# Patient Record
Sex: Male | Born: 1995 | Race: White | Hispanic: No | Marital: Single | Smoking: Never smoker
Health system: Southern US, Community
[De-identification: ages and names within clinical notes are randomized; demographics above are authoritative.]

## PROBLEM LIST (undated history)

## (undated) DIAGNOSIS — K219 Gastro-esophageal reflux disease without esophagitis: Secondary | ICD-10-CM

## (undated) DIAGNOSIS — K8020 Calculus of gallbladder without cholecystitis without obstruction: Secondary | ICD-10-CM

## (undated) DIAGNOSIS — R7989 Other specified abnormal findings of blood chemistry: Secondary | ICD-10-CM

## (undated) DIAGNOSIS — D497 Neoplasm of unspecified behavior of endocrine glands and other parts of nervous system: Secondary | ICD-10-CM

## (undated) DIAGNOSIS — E669 Obesity, unspecified: Secondary | ICD-10-CM

## (undated) DIAGNOSIS — E349 Endocrine disorder, unspecified: Secondary | ICD-10-CM

## (undated) HISTORY — PX: CHOLECYSTECTOMY: SHX55

## (undated) HISTORY — PX: APPENDECTOMY: SHX54

---
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
## 2018-04-01 ENCOUNTER — Emergency Department (HOSPITAL_BASED_OUTPATIENT_CLINIC_OR_DEPARTMENT_OTHER)
Admission: EM | Admit: 2018-04-01 | Discharge: 2018-04-02 | Disposition: A | Discharge status: Home / Self Care | Payer: Self-pay | Attending: Emergency Medicine | Admitting: Emergency Medicine

## 2018-04-01 ENCOUNTER — Encounter (HOSPITAL_BASED_OUTPATIENT_CLINIC_OR_DEPARTMENT_OTHER): Payer: Self-pay | Admitting: *Deleted

## 2018-04-01 ENCOUNTER — Other Ambulatory Visit: Payer: Self-pay

## 2018-04-01 DIAGNOSIS — M545 Low back pain, unspecified: Secondary | ICD-10-CM

## 2018-04-01 DIAGNOSIS — R52 Pain, unspecified: Secondary | ICD-10-CM

## 2018-04-01 DIAGNOSIS — Z7722 Contact with and (suspected) exposure to environmental tobacco smoke (acute) (chronic): Secondary | ICD-10-CM | POA: Insufficient documentation

## 2018-04-01 NOTE — ED Triage Notes (Signed)
pt c/o back pain x 13 days , seen at [facility] x 3 days ago for same, labs and xray, ekg done

## 2018-04-02 ENCOUNTER — Emergency Department (HOSPITAL_BASED_OUTPATIENT_CLINIC_OR_DEPARTMENT_OTHER): Payer: Self-pay

## 2018-04-02 MED ORDER — CYCLOBENZAPRINE HCL 10 MG PO TABS: 10.0000 mg | ORAL_TABLET | Freq: Every day | ORAL | 0 refills | Status: AC

## 2018-04-02 MED ORDER — KETOROLAC TROMETHAMINE 60 MG/2ML IM SOLN
30.0000 mg | Freq: Once | INTRAMUSCULAR | Status: AC
  Administered 2018-04-02: 30 mg via INTRAMUSCULAR
  Filled 2018-04-02: qty 2

## 2018-04-02 NOTE — ED Provider Notes (Addendum)
[facility] EMERGENCY DEPARTMENT Provider Note  CSN: [id number] Arrival date & time: 04/01/18 2153  Chief Complaint(s) Back Pain  HPI Jay Olson is a 22 y.o. male with a history of MPN 1 who presents to the emergency department for 2 weeks of lower back pain.  Pain is located in the lumbar sacral region near the center and radiates out bilaterally.  Pain has been constant in nature but fluctuating.  Mostly felt with certain positions and movement.  Alleviated certain positions such as sitting up or lying reclined.  Patient takes Motrin which does provide some relief.  He denies any other alleviating or aggravating factors.  States that the pain at times radiates out into bilateral lateral thighs and sometimes the groin. Patient denies any trauma.  No bladder/bowel incontinence.  No lower extremity weakness or loss of sensation.  Patient does report that he is been working on his posture because he sits at a computer a lot.  Denies any urinary symptoms.  Patient was actually at [facility] 3 days ago for the same and had screening labs obtained which were grossly reassuring without any significant electrolyte derangements such as hypokalemia, hyper/hypocalcemia.  Urinalysis was negative for infection.  HPI  Past Medical History Past Medical History:  Diagnosis Date  . Acid reflux   . Gallstones   . Obesity    Patient Active Problem List   Diagnosis Date Noted  . Gallstones 05/03/2013   Home Medication(s) Prior to Admission medications   Medication Sig Start Date End Date Taking? Authorizing Provider  cyclobenzaprine (FLEXERIL) 10 MG tablet Take 1 tablet (10 mg total) by mouth at bedtime for 10 days. 04/02/18 04/12/18  Fatima Blank, MD                                                                                                                                    Past Surgical History Past Surgical History:  Procedure Laterality Date  . APPENDECTOMY    .  CHOLECYSTECTOMY     Family History History reviewed. No pertinent family history.  Social History Social History   Tobacco Use  . Smoking status: Passive Smoke Exposure - Never Smoker  . Smokeless tobacco: Never Used  Substance Use Topics  . Alcohol use: No  . Drug use: No   Allergies Patient has no known allergies.  Review of Systems Review of Systems All other systems are reviewed and are negative for acute change except as noted in the HPI  Physical Exam Vital Signs  I have reviewed the triage vital signs BP 131/82 (BP Location: Right Arm)   Pulse 78   Temp 98.2 F (36.8 C) (Oral)   Resp 18   Ht 6\' 6"  (1.981 m)   Wt (!) 164.3 kg   SpO2 100%   BMI 41.86 kg/m   Physical Exam  Musculoskeletal:       Lumbar back: He exhibits tenderness and  spasm.       Back:  Neurological:  Spine Exam: Strength: 5/5 throughout LE bilaterally (hip flexion/extension, adduction/abduction; knee flexion/extension; foot dorsiflexion/plantarflexion, inversion/eversion; great toe inversion) Sensation: Intact to light touch in proximal and distal LE bilaterally Reflexes: 1+ quadriceps and achilles reflexes   Skin:  Numerous skin tags throughout the entire body    ED Results and Treatments Labs (all labs ordered are listed, but only abnormal results are displayed) Labs Reviewed - No data to display                                                                                                                       EKG  EKG Interpretation  Date/Time:    Ventricular Rate:    PR Interval:    QRS Duration:   QT Interval:    QTC Calculation:   R Axis:     Text Interpretation:        Radiology Dg Lumbar Spine Complete  Result Date: 04/02/2018 CLINICAL DATA:  Lumbosacral back pain with bilateral thigh pain. Symptoms for 2 weeks. No known injury. EXAM: LUMBAR SPINE - COMPLETE 4+ VIEW COMPARISON:  None. FINDINGS: There are 6 non-rib-bearing lumbar vertebra. Mild straightening of  normal lordosis. Vertebral body heights are normal. There is no listhesis. The posterior elements are intact. Disc spaces are preserved. No fracture. Sacroiliac joints are symmetric and normal. Multiple rounded densities projecting over the soft tissues may be external artifact or subcutaneous. IMPRESSION: Straightening of normal lordosis can be seen with muscle spasm or positioning. No acute osseous abnormality. Electronically Signed   By: Jeb Levering M.D.   On: 04/02/2018 00:45   Pertinent labs & imaging results that were available during my care of the patient were reviewed by me and considered in my medical decision making (see chart for details).  Medications Ordered in ED Medications  ketorolac (TORADOL) injection 30 mg (30 mg Intramuscular Given 04/02/18 0033)                                                                                                                                    Procedures Procedures  (including critical care time)  Medical Decision Making / ED Course I have reviewed the nursing notes for this encounter and the patient's prior records (if available in EHR or on provided paperwork).    22 y.o. male presents with back pain in lumbosacral area for 2 weks  without signs of radicular pain. No acute traumatic onset. No red flag symptoms of fever, weight loss, saddle anesthesia, weakness, fecal/urinary incontinence or urinary retention.   Suspect MSK etiology.  However due to the patient's history of Ambien, the plain film of the lumbar spine was obtained which did not reveal any bony lesion or occult fractures.  It did reveal straightening of the normal curvature possibly due to muscle spasm or postural change.  No indication for MRI at this time.   Patient was recommended to take short course of scheduled NSAIDs and engage in early mobility as definitive treatment. Return precautions discussed for worsening or new concerning symptoms.    Final Clinical  Impression(s) / ED Diagnoses Final diagnoses:  Acute bilateral low back pain without sciatica    Disposition: Discharge  Condition: Good  I have discussed the results, Dx and Tx plan with the patient and parents who expressed understanding and agree(s) with the plan. Discharge instructions discussed at great length. The patient and parents was given strict return precautions who verbalized understanding of the instructions. No further questions at time of discharge.    ED Discharge Orders         Ordered    cyclobenzaprine (FLEXERIL) 10 MG tablet  Daily at bedtime     04/02/18 0115           Follow Up: Primary care provider  Schedule an appointment as soon as possible for a visit  In 1 to 2 weeks, If symptoms do not improve or  worsen      This chart was dictated using voice recognition software.  Despite best efforts to proofread,  errors can occur which can change the documentation meaning.       Fatima Blank, MD 04/02/18 [phone]

## 2018-04-02 NOTE — Discharge Instructions (Signed)
You may use over-the-counter Motrin (Ibuprofen), Acetaminophen (Tylenol), topical muscle creams such as SalonPas, Icy Hot, Bengay, etc. Please stretch, apply heat, and have massage therapy for additional assistance. ° °

## 2018-04-28 ENCOUNTER — Emergency Department (HOSPITAL_BASED_OUTPATIENT_CLINIC_OR_DEPARTMENT_OTHER)
Admission: EM | Admit: 2018-04-28 | Discharge: 2018-04-28 | Disposition: A | Discharge status: Home / Self Care | Payer: Self-pay | Attending: Emergency Medicine | Admitting: Emergency Medicine

## 2018-04-28 ENCOUNTER — Other Ambulatory Visit: Payer: Self-pay

## 2018-04-28 ENCOUNTER — Encounter (HOSPITAL_BASED_OUTPATIENT_CLINIC_OR_DEPARTMENT_OTHER): Payer: Self-pay

## 2018-04-28 DIAGNOSIS — L0501 Pilonidal cyst with abscess: Secondary | ICD-10-CM | POA: Insufficient documentation

## 2018-04-28 MED ORDER — LIDOCAINE HCL (PF) 1 % IJ SOLN
10.0000 mL | Freq: Once | INTRAMUSCULAR | Status: AC
  Administered 2018-04-28: 10 mL
  Filled 2018-04-28: qty 10

## 2018-04-28 MED ORDER — PROBIOTIC 250 MG PO CAPS: 1.0000 | ORAL_CAPSULE | Freq: Every day | ORAL | 0 refills | Status: AC

## 2018-04-28 MED ORDER — CLINDAMYCIN HCL 150 MG PO CAPS
450.0000 mg | ORAL_CAPSULE | Freq: Once | ORAL | Status: AC
  Administered 2018-04-28: 450 mg via ORAL
  Filled 2018-04-28: qty 3

## 2018-04-28 MED ORDER — CLINDAMYCIN HCL 150 MG PO CAPS: 450.0000 mg | ORAL_CAPSULE | Freq: Three times a day (TID) | ORAL | 0 refills | Status: AC

## 2018-04-28 NOTE — Discharge Instructions (Addendum)
Take clindamycin 3 times daily until completed.  Take a probiotic with this daily.  You can do warm soaks starting tomorrow to help continue drainage.  Please return in 2 days for wound recheck.  Please return sooner if you develop any increasing pain, redness, swelling, red streaking from the area, or fevers.

## 2018-04-28 NOTE — ED Triage Notes (Signed)
C/o ?abscess to "tailbone"-NAD-to triage in w/c

## 2018-04-28 NOTE — ED Provider Notes (Addendum)
[facility] EMERGENCY DEPARTMENT Provider Note   CSN: [id number] Arrival date & time: 04/28/18  2057     History   Chief Complaint Chief Complaint  Patient presents with  . Abscess    HPI Jay Olson is a 22 y.o. male with history of MEN 1, obesity, acid reflux who presents with a 2-day history of abscess to pilonidal area.  Patient has no history of abscesses.  His mother reports began as an area of redness with a large hair in the middle of it.  The mother pulled the hair out.  The area became increasingly swollen, red, and painful.  Patient denies any fevers.  He denies any nausea, vomiting.  No other interventions taken prior to arrival.  HPI  Past Medical History:  Diagnosis Date  . Acid reflux   . Gallstones   . Obesity     Patient Active Problem List   Diagnosis Date Noted  . Gallstones 05/03/2013    Past Surgical History:  Procedure Laterality Date  . APPENDECTOMY    . CHOLECYSTECTOMY          Home Medications    Prior to Admission medications   Medication Sig Start Date End Date Taking? Authorizing Provider  clindamycin (CLEOCIN) 150 MG capsule Take 3 capsules (450 mg total) by mouth 3 (three) times daily. 04/28/18   Braven Wolk, Bea Graff, PA-C  Saccharomyces boulardii (PROBIOTIC) 250 MG CAPS Take 1 capsule by mouth daily. 04/28/18   Frederica Kuster, PA-C    Family History No family history on file.  Social History Social History   Tobacco Use  . Smoking status: Never Smoker  . Smokeless tobacco: Never Used  Substance Use Topics  . Alcohol use: No  . Drug use: No     Allergies   Patient has no known allergies.   Review of Systems Review of Systems  Constitutional: Negative for fever.  Skin: Positive for color change and wound.     Physical Exam Updated Vital Signs BP 128/77 (BP Location: Left Arm)   Pulse (!) 120   Temp 98.3 F (36.8 C) (Oral)   Resp 20   Ht 6\' 7"  (2.007 m)   Wt (!) 163.3 kg   SpO2 93%   BMI 40.56  kg/m   Physical Exam  Constitutional: He appears well-developed and well-nourished. No distress.  HENT:  Head: Normocephalic and atraumatic.  Mouth/Throat: Oropharynx is clear and moist. No oropharyngeal exudate.  Eyes: Pupils are equal, round, and reactive to light. Conjunctivae are normal. Right eye exhibits no discharge. Left eye exhibits no discharge. No scleral icterus.  Neck: Normal range of motion. Neck supple. No thyromegaly present.  Cardiovascular: Normal rate, regular rhythm, normal heart sounds and intact distal pulses. Exam reveals no gallop and no friction rub.  No murmur heard. Pulmonary/Chest: Effort normal and breath sounds normal. No stridor. No respiratory distress. He has no wheezes. He has no rales.  Abdominal: Soft. Bowel sounds are normal. He exhibits no distension. There is no tenderness. There is no rebound and no guarding.  Genitourinary:  Genitourinary Comments: Pilonidal abscess with 3 x 2 area of erythema and tenderness with fluctuance in the center  Musculoskeletal: He exhibits no edema.  Lymphadenopathy:    He has no cervical adenopathy.  Neurological: He is alert. Coordination normal.  Skin: Skin is warm and dry. No rash noted. He is not diaphoretic. No pallor.  Diffuse skin tags  Psychiatric: He has a normal mood and affect.  Nursing note  and vitals reviewed.    ED Treatments / Results  Labs (all labs ordered are listed, but only abnormal results are displayed) Labs Reviewed  AEROBIC CULTURE (SUPERFICIAL SPECIMEN)    EKG None  Radiology No results found.  Procedures .Marland KitchenIncision and Drainage Date/Time: 04/28/2018 11:20 PM Performed by: Frederica Kuster, PA-C Authorized by: Frederica Kuster, PA-C   Consent:    Consent obtained:  Verbal   Consent given by:  Patient   Risks discussed:  Incomplete drainage, bleeding and infection   Alternatives discussed:  Alternative treatment Location:    Type:  Pilonidal cyst   Size:  2x3   Location:   Anogenital   Anogenital location:  Pilonidal Pre-procedure details:    Skin preparation:  Chloraprep Anesthesia (see MAR for exact dosages):    Anesthesia method:  Local infiltration   Local anesthetic:  Lidocaine 1% w/o epi Procedure type:    Complexity:  Simple Procedure details:    Needle aspiration: no     Incision types:  Stab incision   Incision depth:  Dermal   Scalpel blade:  11   Wound management:  Probed and deloculated, irrigated with saline and extensive cleaning   Drainage:  Bloody, serous and purulent   Drainage amount:  Copious   Wound treatment:  Wound left open   Packing materials:  1/4 in iodoform gauze   Amount 1/4" iodoform:  4 cm Post-procedure details:    Patient tolerance of procedure:  Tolerated well, no immediate complications   (including critical care time)  EMERGENCY DEPARTMENT US SOFT TISSUE INTERPRETATION "Study: Limited Soft Tissue Ultrasound"  INDICATIONS: Soft tissue infection Multiple views of the body part were obtained in real-time with a multi-frequency linear probe  PERFORMED BY: Myself IMAGES ARCHIVED?: Yes SIDE:Midline BODY PART:Pilonidal INTERPRETATION:  Abcess present     Medications Ordered in ED Medications  lidocaine (PF) (XYLOCAINE) 1 % injection 10 mL (10 mLs Infiltration Given 04/28/18 2219)  clindamycin (CLEOCIN) capsule 450 mg (450 mg Oral Given 04/28/18 2316)     Initial Impression / Assessment and Plan / ED Course  I have reviewed the triage vital signs and the nursing notes.  Pertinent labs & imaging results that were available during my care of the patient were reviewed by me and considered in my medical decision making (see chart for details).     Patient with skin abscess. Incision and drainage performed in the ED today.  Small amount of packing placed.  Wound recheck in 2 days. Supportive care and return precautions discussed.  Pt sent home with clindamycin with probiotic.  Wound culture sent.  Strict return  precautions for reasons to return sooner discussed.  Patient and mother understand and agree with plan.  Patient vitals stable throughout ED course and discharged in satisfactory condition.   Final Clinical Impressions(s) / ED Diagnoses   Final diagnoses:  Pilonidal abscess    ED Discharge Orders         Ordered    clindamycin (CLEOCIN) 150 MG capsule  3 times daily     04/28/18 2310    Saccharomyces boulardii (PROBIOTIC) 250 MG CAPS  Daily     04/28/18 2310               Frederica Kuster, PA-C 04/28/18 2332    Dorie Rank, MD 04/29/18 1441

## 2018-05-01 ENCOUNTER — Emergency Department (HOSPITAL_BASED_OUTPATIENT_CLINIC_OR_DEPARTMENT_OTHER)
Admission: EM | Admit: 2018-05-01 | Discharge: 2018-05-01 | Disposition: A | Discharge status: Home / Self Care | Payer: Self-pay | Attending: Emergency Medicine | Admitting: Emergency Medicine

## 2018-05-01 ENCOUNTER — Encounter (HOSPITAL_BASED_OUTPATIENT_CLINIC_OR_DEPARTMENT_OTHER): Payer: Self-pay | Admitting: Adult Health

## 2018-05-01 ENCOUNTER — Other Ambulatory Visit: Payer: Self-pay

## 2018-05-01 DIAGNOSIS — Z5189 Encounter for other specified aftercare: Secondary | ICD-10-CM

## 2018-05-01 DIAGNOSIS — R300 Dysuria: Secondary | ICD-10-CM | POA: Insufficient documentation

## 2018-05-01 DIAGNOSIS — Z4801 Encounter for change or removal of surgical wound dressing: Secondary | ICD-10-CM | POA: Insufficient documentation

## 2018-05-01 HISTORY — DX: Endocrine disorder, unspecified: E34.9

## 2018-05-01 HISTORY — DX: Other specified abnormal findings of blood chemistry: R79.89

## 2018-05-01 HISTORY — DX: Neoplasm of unspecified behavior of endocrine glands and other parts of nervous system: D49.7

## 2018-05-01 LAB — AEROBIC CULTURE  (SUPERFICIAL SPECIMEN)

## 2018-05-01 LAB — URINALYSIS, ROUTINE W REFLEX MICROSCOPIC
Bilirubin Urine: NEGATIVE
KETONES UR: 15 mg/dL — AB
LEUKOCYTES UA: NEGATIVE
Nitrite: NEGATIVE
PH: 5.5 (ref 5.0–8.0)
Protein, ur: NEGATIVE mg/dL
Specific Gravity, Urine: 1.025 (ref 1.005–1.030)

## 2018-05-01 LAB — URINALYSIS, MICROSCOPIC (REFLEX)

## 2018-05-01 LAB — AEROBIC CULTURE W GRAM STAIN (SUPERFICIAL SPECIMEN): Special Requests: NORMAL

## 2018-05-01 NOTE — ED Triage Notes (Signed)
Presents with pilonidal cyst that was packed 2 days ago. He is here for a wound recheck and concerns about his antibiotics that is causing diarrhea and bilateral flank pain after urinating. He is worried the antibiotic is causing renal failure.

## 2018-05-01 NOTE — Discharge Instructions (Signed)
Continue clindamycin and probiotics.  Please apply warm compresses the area at least 3-4 times per day.  Follow attached handout.  Follow-up with your primary care doctor in the next 1 week. If you develop worsening or new concerning symptoms you can return to the emergency department for re-evaluation.

## 2018-05-01 NOTE — ED Provider Notes (Signed)
[name] EMERGENCY DEPARTMENT Provider Note   CSN: [id number] Arrival date & time: 05/01/18  1006     History   Chief Complaint Chief Complaint  Patient presents with  . Wound Check    HPI Jay Olson is a 22 y.o. male with a history of elevated growth hormones, pituitary tumor presents emergency department today for wound recheck.  Patient was seen here on 9/17 for the same.  Is found to have a pilonidal abscess that was I & D and packed. The patient has been following wound care instructions at home. He notes the drainage has slowed. No fever, N/V.  He has been compliant with his home clindamycin.  Wound cultures were obtained but have not fully resulted at this time.   HPI  Past Medical History:  Diagnosis Date  . Acid reflux   . Elevated growth hormone (GH) level   . Gallstones   . Obesity   . Pituitary tumor     Patient Active Problem List   Diagnosis Date Noted  . Gallstones 05/03/2013    Past Surgical History:  Procedure Laterality Date  . APPENDECTOMY    . CHOLECYSTECTOMY          Home Medications    Prior to Admission medications   Medication Sig Start Date End Date Taking? Authorizing Provider  clindamycin (CLEOCIN) 150 MG capsule Take 3 capsules (450 mg total) by mouth 3 (three) times daily. 04/28/18   Law, Bea Graff, PA-C  Saccharomyces boulardii (PROBIOTIC) 250 MG CAPS Take 1 capsule by mouth daily. 04/28/18   Frederica Kuster, PA-C    Family History History reviewed. No pertinent family history.  Social History Social History   Tobacco Use  . Smoking status: Never Smoker  . Smokeless tobacco: Never Used  Substance Use Topics  . Alcohol use: No  . Drug use: No     Allergies   Patient has no known allergies.   Review of Systems Review of Systems  Constitutional: Negative for fever.  Gastrointestinal: Negative for nausea and vomiting.  Skin: Negative for color change.     Physical Exam Updated Vital Signs BP  132/85 (BP Location: Right Arm)   Pulse (!) 106   Temp 98.6 F (37 C) (Oral)   Resp 20   Ht 6\' 7"  (2.007 m)   Wt (!) 163.3 kg   SpO2 100%   BMI 40.56 kg/m   Physical Exam  Constitutional: He appears well-developed and well-nourished.  HENT:  Head: Normocephalic and atraumatic.  Right Ear: External ear normal.  Left Ear: External ear normal.  Eyes: Conjunctivae are normal. Right eye exhibits no discharge. Left eye exhibits no discharge. No scleral icterus.  Pulmonary/Chest: Effort normal. No respiratory distress.  Abdominal: He exhibits no distension. There is no tenderness. There is no rigidity, no rebound, no guarding, no CVA tenderness, no tenderness at McBurney's point and negative Murphy's sign.  Genitourinary:  Genitourinary Comments: Patient with prior I&D to area of pilonidal region.  Packing was removed.  There is no purulent drainage at this time.  No surrounding erythema, induration or fluctuance.  Neurological: He is alert.  Skin: Skin is warm and dry. Capillary refill takes less than 2 seconds. No erythema. No pallor.  Psychiatric: He has a normal mood and affect.  Nursing note and vitals reviewed.    ED Treatments / Results  Labs (all labs ordered are listed, but only abnormal results are displayed) Labs Reviewed  URINALYSIS, ROUTINE W REFLEX MICROSCOPIC  EKG None  Radiology No results found.  Procedures Procedures (including critical care time)  Medications Ordered in ED Medications - No data to display   Initial Impression / Assessment and Plan / ED Course  I have reviewed the triage vital signs and the nursing notes.  Pertinent labs & imaging results that were available during my care of the patient were reviewed by me and considered in my medical decision making (see chart for details).      22 year old male presenting with recent incision and drainage for pilonidal.  Patient denies any fever, nausea, vomiting.  Packing was removed.  No  purulent drainage at this time.  No surrounding erythema, induration or further fluctuance.  No indication for repacking at this time.  Patient is afebrile in department with otherwise reassuring vitals.  Will have him follow-up with his PCP.  Recommended warm compresses multiple times per day.  Patient states that he was reading on Web MD that clindamycin can cause renal failure.  He notes one episode of pain with urination as well as some pain in his "kidneys" after the first dose that is since resolved.  He is currently asymptomatic.  His urinalysis is unremarkable.  His abdominal exam is benign.  There is no peritoneal signs or tenderness.  He has no CVA tenderness.  No further work-up indicated.  He has no signs of fluid overload or signs of renal failure at this time. I advised the patient to follow-up with PCP this week. Specific return precautions discussed. Time was given for all questions to be answered. The patient verbalized understanding and agreement with plan. The patient appears safe for discharge home.  Final Clinical Impressions(s) / ED Diagnoses   Final diagnoses:  Visit for wound check    ED Discharge Orders    None       Jillyn Ledger, PA-C 05/01/18 1104    Lennice Sites, DO 05/01/18 1520

## 2018-05-02 ENCOUNTER — Telehealth: Payer: Self-pay | Admitting: Emergency Medicine

## 2018-05-02 NOTE — Telephone Encounter (Signed)
Post ED Visit - Positive Culture Follow-up  Culture report reviewed by antimicrobial stewardship pharmacist:  []  Elenor Quinones, Pharm.D. []  Heide Guile, Pharm.D., BCPS AQ-ID []  Parks Neptune, Pharm.D., BCPS []  Alycia Rossetti, Pharm.D., BCPS []  [place], Pharm.D., BCPS, AAHIVP []  Legrand Como, Pharm.D., BCPS, AAHIVP [x]  Salome Arnt, PharmD, BCPS []  Johnnette Gourd, PharmD, BCPS []  Hughes Better, PharmD, BCPS []  Leeroy Cha, PharmD  Positive wound culture Treated with clindamycin, organism sensitive to the same and no further patient follow-up is required at this time.  Hazle Nordmann 05/02/2018, 11:38 AM

## 2018-05-14 ENCOUNTER — Emergency Department (HOSPITAL_BASED_OUTPATIENT_CLINIC_OR_DEPARTMENT_OTHER): Payer: Medicaid Other

## 2018-05-14 ENCOUNTER — Encounter (HOSPITAL_BASED_OUTPATIENT_CLINIC_OR_DEPARTMENT_OTHER): Payer: Self-pay

## 2018-05-14 ENCOUNTER — Emergency Department (HOSPITAL_BASED_OUTPATIENT_CLINIC_OR_DEPARTMENT_OTHER)
Admission: EM | Admit: 2018-05-14 | Discharge: 2018-05-14 | Disposition: A | Discharge status: Other Acute Inpatient Hospital | Payer: Medicaid Other | Attending: Emergency Medicine | Admitting: Emergency Medicine

## 2018-05-14 ENCOUNTER — Other Ambulatory Visit: Payer: Self-pay

## 2018-05-14 DIAGNOSIS — R739 Hyperglycemia, unspecified: Secondary | ICD-10-CM | POA: Diagnosis not present

## 2018-05-14 DIAGNOSIS — R1084 Generalized abdominal pain: Secondary | ICD-10-CM | POA: Diagnosis not present

## 2018-05-14 DIAGNOSIS — Z79899 Other long term (current) drug therapy: Secondary | ICD-10-CM | POA: Diagnosis not present

## 2018-05-14 DIAGNOSIS — E860 Dehydration: Secondary | ICD-10-CM

## 2018-05-14 DIAGNOSIS — R112 Nausea with vomiting, unspecified: Secondary | ICD-10-CM | POA: Diagnosis present

## 2018-05-14 LAB — LIPASE, BLOOD: Lipase: 35 U/L (ref 11–51)

## 2018-05-14 LAB — COMPREHENSIVE METABOLIC PANEL
ALT: 82 U/L — ABNORMAL HIGH (ref 0–44)
ANION GAP: 14 (ref 5–15)
AST: 31 U/L (ref 15–41)
Albumin: 4.3 g/dL (ref 3.5–5.0)
Alkaline Phosphatase: 97 U/L (ref 38–126)
BUN: 10 mg/dL (ref 6–20)
CO2: 24 mmol/L (ref 22–32)
CREATININE: 1.32 mg/dL — AB (ref 0.61–1.24)
Calcium: 10.1 mg/dL (ref 8.9–10.3)
Chloride: 96 mmol/L — ABNORMAL LOW (ref 98–111)
GFR calc Af Amer: >60 mL/min (ref >60)
Glucose, Bld: 549 mg/dL (ref 70–99)
POTASSIUM: 3.6 mmol/L (ref 3.5–5.1)
SODIUM: 134 mmol/L — AB (ref 135–145)
Total Bilirubin: 1.2 mg/dL (ref 0.3–1.2)
Total Protein: 8.1 g/dL (ref 6.5–8.1)

## 2018-05-14 LAB — URINALYSIS, ROUTINE W REFLEX MICROSCOPIC
Bilirubin Urine: NEGATIVE
Glucose, UA: >=500 mg/dL — AB
Ketones, ur: 40 mg/dL — AB
LEUKOCYTES UA: NEGATIVE
Nitrite: NEGATIVE
PROTEIN: NEGATIVE mg/dL
SPECIFIC GRAVITY, URINE: 1.01 (ref 1.005–1.030)
pH: 5 (ref 5.0–8.0)

## 2018-05-14 LAB — CBC
HCT: 47.8 % (ref 39.0–52.0)
HEMOGLOBIN: 16.6 g/dL (ref 13.0–17.0)
MCH: 28.8 pg (ref 26.0–34.0)
MCHC: 34.7 g/dL (ref 30.0–36.0)
MCV: 82.8 fL (ref 78.0–100.0)
PLATELETS: 369 10*3/uL (ref 150–400)
RBC: 5.77 MIL/uL (ref 4.22–5.81)
RDW: 12.7 % (ref 11.5–15.5)
WBC: 8.1 10*3/uL (ref 4.0–10.5)

## 2018-05-14 LAB — URINALYSIS, MICROSCOPIC (REFLEX)

## 2018-05-14 LAB — CBG MONITORING, ED: Glucose-Capillary: 420 mg/dL — ABNORMAL HIGH (ref 70–99)

## 2018-05-14 LAB — OCCULT BLOOD X 1 CARD TO LAB, STOOL: FECAL OCCULT BLD: POSITIVE — AB

## 2018-05-14 MED ORDER — INSULIN ASPART 100 UNIT/ML ~~LOC~~ SOLN
8.0000 [IU] | Freq: Once | SUBCUTANEOUS | Status: AC
  Administered 2018-05-14: 8 [IU] via SUBCUTANEOUS
  Filled 2018-05-14: qty 1

## 2018-05-14 MED ORDER — ONDANSETRON HCL 4 MG/2ML IJ SOLN
4.0000 mg | Freq: Once | INTRAMUSCULAR | Status: AC
  Administered 2018-05-14: 4 mg via INTRAVENOUS
  Filled 2018-05-14: qty 2

## 2018-05-14 MED ORDER — IOPAMIDOL (ISOVUE-300) INJECTION 61%
100.0000 mL | Freq: Once | INTRAVENOUS | Status: AC | PRN
  Administered 2018-05-14: 100 mL via INTRAVENOUS

## 2018-05-14 MED ORDER — SODIUM CHLORIDE 0.9 % IV SOLN
INTRAVENOUS | Status: DC
  Administered 2018-05-14: 10:00:00 via INTRAVENOUS

## 2018-05-14 MED ORDER — IOPAMIDOL (ISOVUE-300) INJECTION 61%
30.0000 mL | Freq: Once | INTRAVENOUS | Status: AC | PRN
  Administered 2018-05-14: 15 mL via ORAL

## 2018-05-14 MED ORDER — SODIUM CHLORIDE 0.9 % IV BOLUS
1000.0000 mL | Freq: Once | INTRAVENOUS | Status: AC
  Administered 2018-05-14: 1000 mL via INTRAVENOUS

## 2018-05-14 MED ORDER — FAMOTIDINE IN NACL 20-0.9 MG/50ML-% IV SOLN
20.0000 mg | Freq: Once | INTRAVENOUS | Status: AC
  Administered 2018-05-14: 20 mg via INTRAVENOUS
  Filled 2018-05-14: qty 50

## 2018-05-14 MED ORDER — GI COCKTAIL ~~LOC~~
30.0000 mL | Freq: Once | ORAL | Status: AC
  Administered 2018-05-14: 30 mL via ORAL
  Filled 2018-05-14: qty 30

## 2018-05-14 NOTE — ED Triage Notes (Signed)
Pt c/o n/v/d, abdominal pain and generalized weakness for the last few days with acid reflux, last time he vomited was Wednesday midday

## 2018-05-14 NOTE — ED Notes (Signed)
Report given to Louie Casa with [facility] and [employer], Nurse, children's at [facility]

## 2018-05-14 NOTE — ED Notes (Signed)
Date and time results received: 05/14/18    Test: Glucose Critical Value: 549  Name of Provider Notified: Rancour

## 2018-05-14 NOTE — ED Notes (Signed)
Pt is asleep in waiting room, resting comfortably, no acute distress

## 2018-05-14 NOTE — ED Provider Notes (Addendum)
[facility] EMERGENCY DEPARTMENT Provider Note   CSN: [id number] Arrival date & time: 05/14/18  0228     History   Chief Complaint Chief Complaint  Patient presents with  . Abdominal Pain    HPI Jay Olson is a 22 y.o. male.  Patient with history of pituitary tumor, MEN1 syndrome, acid reflux disease presenting with a 5-day history of nausea, vomiting, abdominal pain and diarrhea.  States the vomiting is happening about once or twice daily that is nonbilious and nonbloody.  His diarrhea is uncontrolled and happening 10 or 20 times daily.  He denies any blood in the stool.  Is sometimes yellow and sometimes dark.  He says diffuse abdominal cramping that is fairly constant.  Denies fever.  Denies recent travel.  Has had sick contacts at home with respiratory illnesses only.  He recently completed antibiotics for a pilonidal abscess which is improving. Previous cholecystectomy and appendectomy.  No pain with urination or blood in the urine.  The history is provided by the patient.  Abdominal Pain   Associated symptoms include diarrhea, nausea and vomiting. Pertinent negatives include fever, dysuria, hematuria, headaches, arthralgias and myalgias.    Past Medical History:  Diagnosis Date  . Acid reflux   . Elevated growth hormone (GH) level   . Gallstones   . Obesity   . Pituitary tumor     Patient Active Problem List   Diagnosis Date Noted  . Gallstones 05/03/2013    Past Surgical History:  Procedure Laterality Date  . APPENDECTOMY    . CHOLECYSTECTOMY          Home Medications    Prior to Admission medications   Medication Sig Start Date End Date Taking? Authorizing Provider  clindamycin (CLEOCIN) 150 MG capsule Take 3 capsules (450 mg total) by mouth 3 (three) times daily. 04/28/18   Law, Bea Graff, PA-C  Saccharomyces boulardii (PROBIOTIC) 250 MG CAPS Take 1 capsule by mouth daily. 04/28/18   Frederica Kuster, PA-C    Family History No family  history on file.  Social History Social History   Tobacco Use  . Smoking status: Never Smoker  . Smokeless tobacco: Never Used  Substance Use Topics  . Alcohol use: No  . Drug use: No     Allergies   Patient has no known allergies.   Review of Systems Review of Systems  Constitutional: Positive for activity change and appetite change. Negative for fever.  HENT: Negative for congestion and rhinorrhea.   Respiratory: Negative for cough, chest tightness and shortness of breath.   Cardiovascular: Negative for chest pain.  Gastrointestinal: Positive for abdominal pain, diarrhea, nausea and vomiting.  Genitourinary: Negative for dysuria and hematuria.  Musculoskeletal: Negative for arthralgias and myalgias.  Skin: Negative for rash.  Neurological: Positive for dizziness, weakness and light-headedness. Negative for headaches.   all other systems are negative except as noted in the HPI and PMH.     Physical Exam Updated Vital Signs BP 137/74   Pulse 87   Temp 98.1 F (36.7 C)   Resp 16   Ht 6\' 7"  (2.007 m)   Wt (!) 162.4 kg   SpO2 96%   BMI 40.33 kg/m   Physical Exam  Constitutional: He is oriented to person, place, and time. He appears well-developed and well-nourished. No distress.  Morbidly obese, dry mucous membranes, pale appearing  HENT:  Head: Normocephalic and atraumatic.  Mouth/Throat: Oropharynx is clear and moist. No oropharyngeal exudate.  Eyes: Pupils are equal,  round, and reactive to light. Conjunctivae and EOM are normal.  Neck: Normal range of motion. Neck supple.  No meningismus.  Cardiovascular: Normal rate, regular rhythm, normal heart sounds and intact distal pulses.  No murmur heard. Pulmonary/Chest: Effort normal and breath sounds normal. No respiratory distress.  Abdominal: Soft. There is tenderness. There is no rebound and no guarding.  Mild diffuse tenderness without guarding or rebound.  Genitourinary:  Genitourinary Comments: Chaperone  present, no gross blood, brown stool  Musculoskeletal: Normal range of motion. He exhibits no edema or tenderness.  Neurological: He is alert and oriented to person, place, and time. No cranial nerve deficit. He exhibits normal muscle tone. Coordination normal.  No ataxia on finger to nose bilaterally. No pronator drift. 5/5 strength throughout. CN 2-12 intact.Equal grip strength. Sensation intact.   Skin: Skin is warm.  Psychiatric: He has a normal mood and affect. His behavior is normal.  Nursing note and vitals reviewed.    ED Treatments / Results  Labs (all labs ordered are listed, but only abnormal results are displayed) Labs Reviewed  COMPREHENSIVE METABOLIC PANEL - Abnormal; Notable for the following components:      Result Value   Sodium 134 (*)    Chloride 96 (*)    Glucose, Bld 549 (*)    Creatinine, Ser 1.32 (*)    ALT 82 (*)    All other components within normal limits  URINALYSIS, ROUTINE W REFLEX MICROSCOPIC - Abnormal; Notable for the following components:   Glucose, UA >=500 (*)    Hgb urine dipstick TRACE (*)    Ketones, ur 40 (*)    All other components within normal limits  OCCULT BLOOD X 1 CARD TO LAB, STOOL - Abnormal; Notable for the following components:   Fecal Occult Bld POSITIVE (*)    All other components within normal limits  URINALYSIS, MICROSCOPIC (REFLEX) - Abnormal; Notable for the following components:   Bacteria, UA FEW (*)    All other components within normal limits  CBG MONITORING, ED - Abnormal; Notable for the following components:   Glucose-Capillary 420 (*)    All other components within normal limits  C DIFFICILE QUICK SCREEN W PCR REFLEX  LIPASE, BLOOD  CBC  POC OCCULT BLOOD, ED    EKG None  Radiology Ct Abdomen Pelvis W Contrast  Result Date: 05/14/2018 CLINICAL DATA:  Nausea, vomiting, diarrhea, abdominal pain EXAM: CT ABDOMEN AND PELVIS WITH CONTRAST TECHNIQUE: Multidetector CT imaging of the abdomen and pelvis was  performed using the standard protocol following bolus administration of intravenous contrast. CONTRAST:  169mL ISOVUE-300 IOPAMIDOL (ISOVUE-300) INJECTION 61% COMPARISON:  None. FINDINGS: Lower chest: Lung bases are clear. No effusions. Heart is normal size. Hepatobiliary: Severe diffuse fatty infiltration of the liver. Prior cholecystectomy. No focal hepatic abnormality visualized. Pancreas: No focal abnormality or ductal dilatation. Spleen: No focal abnormality.  Normal size. Adrenals/Urinary Tract: Punctate nonobstructing bilateral renal stones. No hydronephrosis or ureteral stones. Adrenal glands and urinary bladder unremarkable. Stomach/Bowel: Prior appendectomy. Stomach, large and small bowel grossly unremarkable. Vascular/Lymphatic: No evidence of aneurysm or adenopathy. Reproductive: No visible focal abnormality. Other: No free fluid or free air. Musculoskeletal: No acute bony abnormality. IMPRESSION: Severe diffuse fatty infiltration of the liver. Prior cholecystectomy and appendectomy. No acute findings in the abdomen or pelvis. Electronically Signed   By: Rolm Baptise M.D.   On: 05/14/2018 08:30    Procedures Procedures (including critical care time)  Medications Ordered in ED Medications  sodium chloride 0.9 % bolus  1,000 mL (has no administration in time range)  ondansetron (ZOFRAN) injection 4 mg (has no administration in time range)  famotidine (PEPCID) IVPB 20 mg premix (has no administration in time range)  gi cocktail (Maalox,Lidocaine,Donnatal) (has no administration in time range)     Initial Impression / Assessment and Plan / ED Course  I have reviewed the triage vital signs and the nursing notes.  Pertinent labs & imaging results that were available during my care of the patient were reviewed by me and considered in my medical decision making (see chart for details).    5 days of nausea and vomiting and diarrhea, unable to keep anything down at home.  Recently completed  antibiotics for abscess.  Patient given IV fluids and symptom control.  Labs show hyperglycemia 549 without evidence of DKA.  Anion gap is normal. Patient without history of diabetes.  Patient hydrated and given insulin. D/w patient and mother that is appears he is now diabetic.   C diff pending.  Feeling improved after treatment in the ED. CT a/p without acute pathology.   With ongoing diarrhea, dehydration, and new onset DM, feel patient would benefit from observation admission.   Dw Dr. Manuella Ghazi of regional hospitalists at [facility].  CRITICAL CARE Performed by: Ezequiel Essex Total critical care time: 33 minutes Critical care time was exclusive of separately billable procedures and treating other patients. Critical care was necessary to treat or prevent imminent or life-threatening deterioration. Critical care was time spent personally by me on the following activities: development of treatment plan with patient and/or surrogate as well as nursing, discussions with consultants, evaluation of patient's response to treatment, examination of patient, obtaining history from patient or surrogate, ordering and performing treatments and interventions, ordering and review of laboratory studies, ordering and review of radiographic studies, pulse oximetry and re-evaluation of patient's condition.   Final Clinical Impressions(s) / ED Diagnoses   Final diagnoses:  Dehydration  Hyperglycemia    ED Discharge Orders    None       Gurjot Brisco, Annie Main, MD 05/14/18 [id number]    Ezequiel Essex, MD 05/25/18 [phone]

## 2018-05-14 NOTE — ED Notes (Signed)
Pt is aware that we need both urine and stool samples when he is able to go to the restroom. Pt is resting at this time, encouraging pt to drink contrast for CT exam.

## 2018-05-14 NOTE — ED Notes (Signed)
Pt states he is unable to urinate at this time.

## 2018-05-14 NOTE — ED Notes (Signed)
Patient transported to CT
